# Patient Record
Sex: Male | Born: 1995 | Race: White | Hispanic: No | Marital: Single
Health system: Southern US, Community
[De-identification: ages and names within clinical notes are randomized; demographics above are authoritative.]

---

## 2016-09-03 ENCOUNTER — Encounter: Payer: Self-pay | Admitting: Emergency Medicine

## 2016-09-03 ENCOUNTER — Emergency Department
Admission: EM | Admit: 2016-09-03 | Discharge: 2016-09-03 | Disposition: A | Discharge status: Home / Self Care | Payer: Managed Care, Other (non HMO) | Attending: Student in an Organized Health Care Education/Training Program | Admitting: Student in an Organized Health Care Education/Training Program

## 2016-09-03 DIAGNOSIS — F10120 Alcohol abuse with intoxication, uncomplicated: Secondary | ICD-10-CM | POA: Diagnosis not present

## 2016-09-03 DIAGNOSIS — F10920 Alcohol use, unspecified with intoxication, uncomplicated: Secondary | ICD-10-CM

## 2016-09-03 LAB — ETHANOL: ALCOHOL ETHYL (B): 236 mg/dL — AB (ref <5)

## 2016-09-03 NOTE — ED Notes (Signed)
Woke pt up. Sleepy, knew he was in hospital. Asked if he felt ok, stated he was. Told he to call parents and offered him the phone. He said not right now and closed his eyes.

## 2016-09-03 NOTE — ED Provider Notes (Signed)
[facility] Emergency Department Provider Note  ____________________________________________  Time seen: Approximately 4:55 AM  I have reviewed the triage vital signs and the nursing notes.   HISTORY  Chief Complaint Alcohol Intoxication  Level 5 caveat:  Portions of the history and physical were unable to be obtained due to intoxication   HPI Charles Dennis is a 21 y.o. male with no significant past medical history who presents for alcohol intoxication. According to EMS patient was at a fraternity party this evening where he had 6 beers.Patient was then unresponsive per friends which prompted EMS to be called. Patient was vomiting and was incontinent of stool upon arrival to the ED. Combative initially per EMS. Patient not answering any questions.  History reviewed. No pertinent past medical history.  There are no active problems to display for this patient.   History reviewed. No pertinent surgical history.  Prior to Admission medications   Not on File    Allergies Patient has no known allergies.  History reviewed. No pertinent family history.  Social History Social History  Substance Use Topics  . Smoking status: Unknown If Ever Smoked  . Smokeless tobacco: Not on file  . Alcohol use Yes    Review of Systems Unable to obtain ____________________________________________   PHYSICAL EXAM:  VITAL SIGNS: ED Triage Vitals [09/03/16 0438]  Enc Vitals Group     BP (!) 108/53     Pulse Rate 70     Resp 14     Temp 97.1 F (36.2 C)     Temp Source Axillary     SpO2 98 %     Weight 180 lb (81.6 kg)     Height 6\' 2"  (1.88 m)     Head Circumference      Peak Flow      Pain Score      Pain Loc      Pain Edu?      Excl. in GC?     Constitutional: Sleepy, opens eyes to sternal rub, not answering any questions, intoxicated. HEENT:      Head: Normocephalic and atraumatic.         Eyes: Conjunctivae are normal. Sclera is  non-icteric. EOMI. PERRL      Mouth/Throat: Mucous membranes are moist.       Neck: Supple with no signs of meningismus. Cardiovascular: Regular rate and rhythm. No murmurs, gallops, or rubs. 2+ symmetrical distal pulses are present in all extremities. No JVD. Respiratory: Normal respiratory effort. Lungs are clear to auscultation bilaterally. No wheezes, crackles, or rhonchi.  Gastrointestinal: Soft, non tender, and non distended with positive bowel sounds. No rebound or guarding. Musculoskeletal: Nontender with normal range of motion in all extremities. No edema, cyanosis, or erythema of extremities. Neurologic: Face is symmetric. Moving all extremities. No gross focal neurologic deficits are appreciated. Skin: Skin is warm, dry and intact. No rash noted.   ____________________________________________   LABS (all labs ordered are listed, but only abnormal results are displayed)  Labs Reviewed  ETHANOL - Abnormal; Notable for the following:       Result Value   Alcohol, Ethyl (B) 236 (*)    All other components within normal limits   ____________________________________________  EKG  none  ____________________________________________  RADIOLOGY  none  ____________________________________________   PROCEDURES  Procedure(s) performed: None Procedures Critical Care performed:  None ____________________________________________   INITIAL IMPRESSION / ASSESSMENT AND PLAN / ED COURSE  21 y.o. male with no significant past medical history  who presents for alcohol intoxication. Patient is intoxicated and not answering questions, protecting airway, normal vital signs, breathing and satting 98% on room air. No signs of trauma. Will monitor until sober and reassess. Alcohol level pending.    _________________________ 6:37 AM on 09/03/2016 ----------------------------------------- Alcohol level 236. Patient remains clinically intoxicated. Care transferred to Dr. Roxan Hockeyobinson at  [facility]  Pertinent labs & imaging results that were available during my care of the patient were reviewed by me and considered in my medical decision making (see chart for details).    ____________________________________________   FINAL CLINICAL IMPRESSION(S) / ED DIAGNOSES  Final diagnoses:  Alcoholic intoxication without complication (HCC)      NEW MEDICATIONS STARTED DURING THIS VISIT:  New Prescriptions   No medications on file     Note:  This document was prepared using Dragon voice recognition software and may include unintentional dictation errors.    Nita Sicklearolina Shin Lamour, MD 09/03/16 [phone]

## 2016-09-03 NOTE — ED Provider Notes (Signed)
Patient received in sign-out from Dr. Don PerkingVeronese.  Workup and evaluation pending metabolization of his alcohol and further observation. Patient remains hemodynamically stable. Blood pressure measured as low but while patient was sleeping patient remains well perfused.  Patient was able to tolerate PO and was able to ambulate with a steady gait.  Patient advised not to drive today and does have a safe disposition back to school. The patient is stable for discharge home.[name].      Charles Portillo, MD 09/03/16 1136

## 2016-09-03 NOTE — ED Triage Notes (Signed)
Pt to rm 24 via EMS, EMS report etoh intoxication w/ 6 beers, reports vomiting and BM incontinence.  Pt responsive to stimuli but non-verbal at this time, NAD noted

## 2016-09-03 NOTE — ED Notes (Signed)
Pt's father called, Nat ChristenSteve Reyez, [phone].  Pt unable to give permission to share health information.  Pt's father informed only that pt is here and stable.  Pt to be encouraged to call father when sober

## 2016-09-03 NOTE — Discharge Instructions (Addendum)
You have been seen in the Emergency Department (ED) today for alcohol intoxication.  As we have discussed, please do NOT drink alcohol in excess as alcohol intoxication can be life threatening. NEVER drive or operate machinery after drinking alcohol.  Having fun in college is part of life however this evening you almost ended up intubated in the ICU because of your drinking. If you need help with your drinking contact your local AA.    Do NOT drive today.  Drink plenty of water or other clear liquids (Gatorade, Powerade, etc) for the next 24 hours to help your body recover and stay hydrated.  Follow-up care is a key part of your treatment and safety. Follow up with your doctor in the next 2-5 days. If you need help to stop drinking please call [facility] at [phone]  How can you care for yourself at home?  Be safe with medicines. Take your medicines exactly as prescribed. Call your doctor if you think you are having a problem with your medicine.  Your doctor may have prescribed disulfiram (Antabuse). Do not drink any alcohol while you are taking this medicine. You may have severe or even life-threatening side effects from even small amounts of alcohol.  If you were given medicine to prevent nausea, be sure to take it exactly as prescribed.  Before you take any medicine, tell your doctor if:  You have had a bad reaction to any medicines in the past.  You are taking other medicines, including over-the-counter ones, or have other health problems.  You are or could be pregnant. Be prepared to have some symptoms of withdrawal in the next few days which includes agitation, tremors, anxiety, sweating, headache, nausea, vomiting. Drink plenty of liquids in the next few days to prevent dehydration. Seek help if you need it to stop drinking. Getting counseling and joining a support group can help you stay sober. Try a support group such as Alcoholics Anonymous.  Avoid alcohol when you take medicines. It can  react with many medicines and cause serious problems.  When should you call for help?  Call 911 anytime you think you may need emergency care. For example, call if:  You feel confused and are seeing things that are not there.  You are thinking about killing yourself or hurting others.  You have a seizure.  You vomit blood or what looks like coffee grounds.  Call your doctor now or seek immediate medical care if:  You have trembling, restlessness, sweating, and other withdrawal symptoms that are new or that get worse.  Your withdrawal symptoms come back after not bothering you for days or weeks.  You can't stop vomiting.  Watch closely for changes in your health, and be sure to contact your doctor if:  You need help to stop drinking.

## 2017-10-31 ENCOUNTER — Emergency Department: Payer: Managed Care, Other (non HMO)

## 2017-10-31 ENCOUNTER — Encounter: Payer: Self-pay | Admitting: Emergency Medicine

## 2017-10-31 ENCOUNTER — Emergency Department
Admission: EM | Admit: 2017-10-31 | Discharge: 2017-10-31 | Disposition: A | Discharge status: Home / Self Care | Payer: Managed Care, Other (non HMO) | Attending: Emergency Medicine | Admitting: Emergency Medicine

## 2017-10-31 DIAGNOSIS — J039 Acute tonsillitis, unspecified: Secondary | ICD-10-CM | POA: Diagnosis not present

## 2017-10-31 DIAGNOSIS — J029 Acute pharyngitis, unspecified: Secondary | ICD-10-CM | POA: Diagnosis present

## 2017-10-31 LAB — COMPREHENSIVE METABOLIC PANEL
ALBUMIN: 3.6 g/dL (ref 3.5–5.0)
ALT: 21 U/L (ref 17–63)
AST: 21 U/L (ref 15–41)
Alkaline Phosphatase: 66 U/L (ref 38–126)
Anion gap: 7 (ref 5–15)
BUN: 8 mg/dL (ref 6–20)
CHLORIDE: 99 mmol/L — AB (ref 101–111)
CO2: 30 mmol/L (ref 22–32)
CREATININE: 0.74 mg/dL (ref 0.61–1.24)
Calcium: 9.6 mg/dL (ref 8.9–10.3)
GFR calc Af Amer: >60 mL/min (ref >60)
GLUCOSE: 102 mg/dL — AB (ref 65–99)
Potassium: 4.3 mmol/L (ref 3.5–5.1)
SODIUM: 136 mmol/L (ref 135–145)
Total Bilirubin: 1.3 mg/dL — ABNORMAL HIGH (ref 0.3–1.2)
Total Protein: 8.3 g/dL — ABNORMAL HIGH (ref 6.5–8.1)

## 2017-10-31 LAB — CBC
HCT: 39.1 % — ABNORMAL LOW (ref 40.0–52.0)
Hemoglobin: 13.5 g/dL (ref 13.0–18.0)
MCH: 30.9 pg (ref 26.0–34.0)
MCHC: 34.7 g/dL (ref 32.0–36.0)
MCV: 89.2 fL (ref 80.0–100.0)
Platelets: 257 10*3/uL (ref 150–440)
RBC: 4.38 MIL/uL — ABNORMAL LOW (ref 4.40–5.90)
RDW: 12.3 % (ref 11.5–14.5)
WBC: 13.3 10*3/uL — AB (ref 3.8–10.6)

## 2017-10-31 MED ORDER — IOPAMIDOL (ISOVUE-370) INJECTION 76%
75.0000 mL | Freq: Once | INTRAVENOUS | Status: AC | PRN
  Administered 2017-10-31: 75 mL via INTRAVENOUS
  Filled 2017-10-31: qty 75

## 2017-10-31 MED ORDER — SODIUM CHLORIDE 0.9 % IV BOLUS
1000.0000 mL | Freq: Once | INTRAVENOUS | Status: AC
  Administered 2017-10-31: 1000 mL via INTRAVENOUS

## 2017-10-31 MED ORDER — DEXAMETHASONE SODIUM PHOSPHATE 10 MG/ML IJ SOLN
10.0000 mg | Freq: Once | INTRAMUSCULAR | Status: AC
Start: 2017-10-31 — End: 2017-10-31
  Administered 2017-10-31: 10 mg via INTRAMUSCULAR
  Filled 2017-10-31: qty 1

## 2017-10-31 MED ORDER — LEVOFLOXACIN 500 MG PO TABS: 500.0000 mg | ORAL_TABLET | Freq: Every day | ORAL | 0 refills | Status: AC

## 2017-10-31 MED ORDER — PREDNISONE 10 MG PO TABS: ORAL_TABLET | ORAL | 0 refills | Status: AC

## 2017-10-31 MED ORDER — CLINDAMYCIN PHOSPHATE 600 MG/50ML IV SOLN
600.0000 mg | Freq: Once | INTRAVENOUS | Status: AC
  Administered 2017-10-31: 600 mg via INTRAVENOUS
  Filled 2017-10-31: qty 50

## 2017-10-31 NOTE — ED Triage Notes (Signed)
Pt reports just getting over strep and went to UC today and they sent him to the ED to have his tonsil drained.

## 2017-10-31 NOTE — ED Notes (Signed)
See triage note  States he was recently treated for strept with 10 days go clindamycin.. Developed sore throat yesterday morning   Pain is mainly to right side of throat   Denies any fever

## 2017-10-31 NOTE — Discharge Instructions (Addendum)
Gargle with salt water three times a day. Follow up with ENT this week.

## 2017-10-31 NOTE — ED Notes (Signed)
Waiting on f/u with ENT    Friend at bedside

## 2017-10-31 NOTE — ED Provider Notes (Signed)
[facility] Emergency Department Provider Note  ____________________________________________  Time seen: Approximately 10:24 AM  I have reviewed the triage vital signs and the nursing notes.   HISTORY  Chief Complaint Sore Throat    HPI Charles Dennis is a 22 y.o. male that presents to the emergency department for evaluation of sore throat for 2 weeks that worsened today.  Patient states that he was given 10 days worth of clindamycin at urgent care and finished 9 days of medication last week.  Sore throat improved and then worsened yesterday.  He took the last days worth of clindamycin last night.  He has not checked his temperature but had chills yesterday.  He is able to swallow but with pain.  No shortness of breath.   History reviewed. No pertinent past medical history.  There are no active problems to display for this patient.   History reviewed. No pertinent surgical history.  Prior to Admission medications   Medication Sig Start Date End Date Taking? Authorizing Provider  levofloxacin (LEVAQUIN) 500 MG tablet Take 1 tablet (500 mg total) by mouth daily for 10 days. 10/31/17 11/10/17  Enid Derry, PA-C  predniSONE (DELTASONE) 10 MG tablet Take 6 tablets on day 1 and 2, take 5 tablets on day 3 and 4, take 4 tablets on day 5 and 6, take 3 tablets on day 7 and 8, take 2 tablets on day 9 and 10, take 1 tablet on day 11 and 12 10/31/17   Enid Derry, PA-C    Allergies Penicillins  No family history on file.  Social History Social History   Tobacco Use  . Smoking status: Unknown If Ever Smoked  Substance Use Topics  . Alcohol use: Yes  . Drug use: Not on file     Review of Systems  ENT: Negative for congestion and rhinorrhea. Cardiovascular: No chest pain. Respiratory: Negative for cough. No SOB. Gastrointestinal: No abdominal pain.  No nausea, no vomiting.   Musculoskeletal: Negative for musculoskeletal pain. Neurological:  Negative for headaches.   ____________________________________________   PHYSICAL EXAM:  VITAL SIGNS: ED Triage Vitals  Enc Vitals Group     BP 10/31/17 0944 113/66     Pulse Rate 10/31/17 0944 83     Resp 10/31/17 0944 20     Temp 10/31/17 0944 98 F (36.7 C)     Temp Source 10/31/17 0944 Oral     SpO2 10/31/17 0944 100 %     Weight 10/31/17 0945 158 lb (71.7 kg)     Height 10/31/17 0945 6' (1.829 m)     Head Circumference --      Peak Flow --      Pain Score 10/31/17 0945 10     Pain Loc --      Pain Edu? --      Excl. in GC? --      Constitutional: Alert and oriented. Well appearing and in no acute distress. Eyes: Conjunctivae are normal. PERRL. EOMI. No discharge. Head: Atraumatic. ENT: No frontal and maxillary sinus tenderness.      Ears: Tympanic membranes pearly gray with good landmarks. No discharge.      Nose: No congestion/rhinnorhea.      Mouth/Throat: Mucous membranes are moist. Oropharynx non-erythematous. Tonsils enlarged bilaterally, 2+ on the right, 1+ on the left. No exudates. Uvula midline. Neck: No stridor.   Hematological/Lymphatic/Immunilogical: Anterior cervical lymphadenopathy. Cardiovascular: Normal rate, regular rhythm.  Good peripheral circulation. Respiratory: Normal respiratory effort without tachypnea or retractions. Lungs  CTAB. Good air entry to the bases with no decreased or absent breath sounds. Gastrointestinal: Bowel sounds 4 quadrants. Soft and nontender to palpation. No guarding or rigidity. No palpable masses. No distention. Musculoskeletal: Full range of motion to all extremities. No gross deformities appreciated. Neurologic:  Normal speech and language. No gross focal neurologic deficits are appreciated.  Skin:  Skin is warm, dry and intact.    ____________________________________________   LABS (all labs ordered are listed, but only abnormal results are displayed)  Labs Reviewed  CBC - Abnormal; Notable for the following  components:      Result Value   WBC 13.3 (*)    RBC 4.38 (*)    HCT 39.1 (*)    All other components within normal limits  COMPREHENSIVE METABOLIC PANEL - Abnormal; Notable for the following components:   Chloride 99 (*)    Glucose, Bld 102 (*)    Total Protein 8.3 (*)    Total Bilirubin 1.3 (*)    All other components within normal limits   ____________________________________________  EKG   ____________________________________________  RADIOLOGY Lexine Baton, personally viewed and evaluated these images (plain radiographs) as part of my medical decision making, as well as reviewing the written report by the radiologist.  Ct Soft Tissue Neck W Contrast  Result Date: 10/31/2017 CLINICAL DATA:  Tonsils/adenoid disorder EXAM: CT NECK WITH CONTRAST TECHNIQUE: Multidetector CT imaging of the neck was performed using the standard protocol following the bolus administration of intravenous contrast. CONTRAST:  75mL ISOVUE-370 IOPAMIDOL (ISOVUE-370) INJECTION 76% COMPARISON:  None. FINDINGS: Pharynx and larynx: Thickened tonsils, especially the palatine, with asymmetric low-density thickening at the base of the right tonsillar fossa. Regional submucosal edema not reaching the larynx. Edema tracks into the submental space without floor of mouth swelling. No drainable collection. No retropharyngeal effusion. Salivary glands: No inflammation, mass, or stone. Thyroid: Normal. Lymph nodes: Bilateral cervical chain lymph node enlargement, greater on the right. No cavitation Vascular: Negative Limited intracranial: Negative Visualized orbits: Negative Mastoids and visualized paranasal sinuses: Fluid level in the right maxillary sinus. Patchy mucosal thickening. Retention cyst in the left maxillary sinus. Skeleton: Negative Upper chest: Negative IMPRESSION: 1. Tonsillitis with right peritonsillar phlegmon. No drainable collection. 2. Cervical adenitis 3. Sinusitis with right maxillary fluid level.  Electronically Signed   By: Marnee Spring M.D.   On: 10/31/2017 12:02    ____________________________________________    PROCEDURES  Procedure(s) performed:    Procedures    Medications  sodium chloride 0.9 % bolus 1,000 mL (0 mLs Intravenous Stopped 10/31/17 1410)  dexamethasone (DECADRON) injection 10 mg (10 mg Intramuscular Given 10/31/17 1112)  clindamycin (CLEOCIN) IVPB 600 mg (0 mg Intravenous Stopped 10/31/17 1410)  iopamidol (ISOVUE-370) 76 % injection 75 mL (75 mLs Intravenous Contrast Given 10/31/17 1146)     ____________________________________________   INITIAL IMPRESSION / ASSESSMENT AND PLAN / ED COURSE  Pertinent labs & imaging results that were available during my care of the patient were reviewed by me and considered in my medical decision making (see chart for details).  Review of the [place] CSRS was performed in accordance of the [facility] prior to dispensing any controlled drugs.   Patient's diagnosis is consistent with tonsillitis with plegmon. Vital signs and exam are reassuring. CT neck consistent with tonsillitis with phlegmon. No peritonsillar abscess.  Strep test was not ordered since patient took a dose of antibiotics yesterday.  Patient finished 9 days of  Clindamycin TID last week. He is allergic to amoxicillin and gets  full body hives with medication. ENT was paged several times without response so Dr. Lamont Snowball was consulted and recommended continued ENT page.  Dr. Elenore Rota was consulted and recommended that patient begin  Levaquin and prednisone taper.  Patient will make an appointment in office. Patient is hungry and wants to go home.  Patient appears well. Patient will be discharged home with prescriptions for Levaquin and Prednisone. Patient is to follow up with ENT as needed or otherwise directed. Patient is given ED precautions to return to the ED for any worsening or new symptoms.     ____________________________________________  FINAL CLINICAL  IMPRESSION(S) / ED DIAGNOSES  Final diagnoses:  Tonsillitis      NEW MEDICATIONS STARTED DURING THIS VISIT:  ED Discharge Orders        Ordered    levofloxacin (LEVAQUIN) 500 MG tablet  Daily     10/31/17 1546    predniSONE (DELTASONE) 10 MG tablet     10/31/17 1546          This chart was dictated using voice recognition software/Dragon. Despite best efforts to proofread, errors can occur which can change the meaning. Any change was purely unintentional.    Enid Derry, PA-C 10/31/17 1624    Merrily Brittle, MD 10/31/17 [phone]

## 2017-11-02 ENCOUNTER — Other Ambulatory Visit
Admission: RE | Admit: 2017-11-02 | Discharge: 2017-11-02 | Disposition: A | Discharge status: Home / Self Care | Payer: Managed Care, Other (non HMO) | Source: Ambulatory Visit | Attending: Otolaryngology | Admitting: Otolaryngology

## 2017-11-02 DIAGNOSIS — J039 Acute tonsillitis, unspecified: Secondary | ICD-10-CM | POA: Diagnosis not present

## 2017-11-02 LAB — MONONUCLEOSIS SCREEN: Mono Screen: NEGATIVE

## 2017-11-03 ENCOUNTER — Emergency Department: Payer: Managed Care, Other (non HMO)

## 2017-11-03 DIAGNOSIS — R2241 Localized swelling, mass and lump, right lower limb: Secondary | ICD-10-CM | POA: Insufficient documentation

## 2017-11-03 DIAGNOSIS — Z79899 Other long term (current) drug therapy: Secondary | ICD-10-CM | POA: Diagnosis not present

## 2017-11-03 DIAGNOSIS — R233 Spontaneous ecchymoses: Secondary | ICD-10-CM | POA: Insufficient documentation

## 2017-11-03 DIAGNOSIS — N5089 Other specified disorders of the male genital organs: Secondary | ICD-10-CM | POA: Diagnosis not present

## 2017-11-03 DIAGNOSIS — M79606 Pain in leg, unspecified: Secondary | ICD-10-CM | POA: Diagnosis present

## 2017-11-03 LAB — CBC WITH DIFFERENTIAL/PLATELET
BASOS PCT: 0 %
Basophils Absolute: 0 10*3/uL (ref 0–0.1)
Eosinophils Absolute: 0 10*3/uL (ref 0–0.7)
Eosinophils Relative: 0 %
HEMATOCRIT: 35.9 % — AB (ref 40.0–52.0)
Hemoglobin: 12.7 g/dL — ABNORMAL LOW (ref 13.0–18.0)
LYMPHS PCT: 13 %
Lymphs Abs: 1.2 10*3/uL (ref 1.0–3.6)
MCH: 31.5 pg (ref 26.0–34.0)
MCHC: 35.5 g/dL (ref 32.0–36.0)
MCV: 88.7 fL (ref 80.0–100.0)
MONO ABS: 0.9 10*3/uL (ref 0.2–1.0)
MONOS PCT: 10 %
Neutro Abs: 7.1 10*3/uL — ABNORMAL HIGH (ref 1.4–6.5)
Neutrophils Relative %: 77 %
Platelets: 302 10*3/uL (ref 150–440)
RBC: 4.05 MIL/uL — ABNORMAL LOW (ref 4.40–5.90)
RDW: 12.6 % (ref 11.5–14.5)
WBC: 9.3 10*3/uL (ref 3.8–10.6)

## 2017-11-03 LAB — COMPREHENSIVE METABOLIC PANEL
ALK PHOS: 57 U/L (ref 38–126)
ALT: 25 U/L (ref 17–63)
AST: 30 U/L (ref 15–41)
Albumin: 3.6 g/dL (ref 3.5–5.0)
Anion gap: 9 (ref 5–15)
BUN: 15 mg/dL (ref 6–20)
CALCIUM: 9.4 mg/dL (ref 8.9–10.3)
CO2: 27 mmol/L (ref 22–32)
CREATININE: 0.7 mg/dL (ref 0.61–1.24)
Chloride: 101 mmol/L (ref 101–111)
Glucose, Bld: 111 mg/dL — ABNORMAL HIGH (ref 65–99)
Potassium: 4 mmol/L (ref 3.5–5.1)
Sodium: 137 mmol/L (ref 135–145)
Total Bilirubin: 0.5 mg/dL (ref 0.3–1.2)
Total Protein: 8.2 g/dL — ABNORMAL HIGH (ref 6.5–8.1)

## 2017-11-03 LAB — PROTIME-INR
INR: 1.02
Prothrombin Time: 13.3 seconds (ref 11.4–15.2)

## 2017-11-03 NOTE — ED Triage Notes (Signed)
patient c/o right calf pain beginning Tuesday. Patient Patient reports long car drive on Sunday. Patient reports being prescribed clindamycin on Sunday for pharyngitis. Patient reports no relief of symptoms, and was started on levaquin and steroids on Tuesday. Patient also had CT neck performed to rule out abscess. Patient negative for both abscess and mononucleosis. Patient was started on Septra on Wednesday by ENT MD. Patient reports left testicle swelling beginning yesterday. Patient has swelling noted to right calf on assessment. Patient has purpura to bilateral feet. Patient has bruising to right medial ankle. Patient has weak pulse right foot.

## 2017-11-04 ENCOUNTER — Emergency Department: Payer: Managed Care, Other (non HMO)

## 2017-11-04 ENCOUNTER — Emergency Department
Admission: EM | Admit: 2017-11-04 | Discharge: 2017-11-04 | Disposition: A | Discharge status: Home / Self Care | Payer: Managed Care, Other (non HMO) | Attending: Emergency Medicine | Admitting: Emergency Medicine

## 2017-11-04 DIAGNOSIS — N5089 Other specified disorders of the male genital organs: Secondary | ICD-10-CM

## 2017-11-04 NOTE — Discharge Instructions (Addendum)
Please return for worse rash, fever or any other change in symptoms. Please stop taking the Bactrim for now. Dr. Smith Robert should arrange follow-up for you in the next day or 2. If she does not call you please call her office and arrange follow-up again in the next few days. the ultrasound only showed a varicocele. I would just follow up with her primary care doctor for this.

## 2017-11-04 NOTE — ED Provider Notes (Signed)
[facility] Emergency Department Provider Note   ____________________________________________   First MD Initiated Contact with Patient 11/04/17 0119     (approximate)  I have reviewed the triage vital signs and the nursing notes.   HISTORY  Chief Complaint Leg Pain    HPI Charles Dennis is a 22 y.o. male Patient reports initially came in with sore throat was put on clindamycin. His CT is neck which showed a developing abscess in the tonsil but had not turned into a complete abscess yet. He also had sinusitis on the CT. He had almost completed the course of antibiotics was switched to Levaquin but then developed pain in the calf to the Levaquin was stopped after 1 dose and he was switched to Bactrim. Patient also developed a rash on both of his legs and about the time his put on the Levaquin. The rash is petechial. He is not running a fever. He has no belly pain. He does complain of some left scrotal swelling. Ultrasound of the scrotum today showed varicocele.  History reviewed. No pertinent past medical history.  There are no active problems to display for this patient.   History reviewed. No pertinent surgical history.  Prior to Admission medications   Medication Sig Start Date End Date Taking? Authorizing Provider  levofloxacin (LEVAQUIN) 500 MG tablet Take 1 tablet (500 mg total) by mouth daily for 10 days. 10/31/17 11/10/17  Enid Derry, PA-C  predniSONE (DELTASONE) 10 MG tablet Take 6 tablets on day 1 and 2, take 5 tablets on day 3 and 4, take 4 tablets on day 5 and 6, take 3 tablets on day 7 and 8, take 2 tablets on day 9 and 10, take 1 tablet on day 11 and 12 10/31/17   Enid Derry, PA-C    Allergies Penicillins  No family history on file.  Social History Social History   Tobacco Use  . Smoking status: Unknown If Ever Smoked  Substance Use Topics  . Alcohol use: Yes  . Drug use: Not on file    Review of  Systems  Constitutional: No fever/chills Eyes: No visual changes. ENT: No sore throat. Cardiovascular: Denies chest pain. Respiratory: Denies shortness of breath. Gastrointestinal: No abdominal pain.  No nausea, no vomiting.  No diarrhea.  No constipation. Genitourinary: Negative for dysuria. Musculoskeletal: Negative for back pain. Skin: Negative for rash. Neurological: Negative for headaches, focal weakness or numbness.   ____________________________________________   PHYSICAL EXAM:  VITAL SIGNS: ED Triage Vitals  Enc Vitals Group     BP 11/03/17 2041 (!) 141/78     Pulse Rate 11/03/17 2041 88     Resp 11/03/17 2041 18     Temp 11/03/17 2041 98.8 F (37.1 C)     Temp src --      SpO2 11/03/17 2041 98 %     Weight 11/03/17 2041 160 lb (72.6 kg)     Height 11/03/17 2041 6' (1.829 m)     Head Circumference --      Peak Flow --      Pain Score 11/03/17 2040 5     Pain Loc --      Pain Edu? --      Excl. in GC? --     Constitutional: Alert and oriented. Well appearing and in no acute distress. Eyes: Conjunctivae are normal. Head: Atraumatic. Nose: No congestion/rhinnorhea. Mouth/Throat: Mucous membranes are moist.  Oropharynx non-erythematous.no further swelling Neck: No stridor.  no tender or swollen lymph  nodes Cardiovascular: Normal rate, regular rhythm. Grossly normal heart sounds.  Good peripheral circulation. Respiratory: Normal respiratory effort.  No retractions. Lungs CTAB. Gastrointestinal: Soft and nontender. No distention. No abdominal bruits. No CVA tenderness.no organomegaly Musculoskeletal: No lower extremity tenderness nor edema.  No joint effusions. Neurologic:  Normal speech and language. No gross focal neurologic deficits are appreciated. No gait instability. Skin:  Skin is warm, dry and intact. there is a petechial rash from the knees down on both legs including the soles of feet. The right sole is also bruised. The lright leg is somewhat swollen.  But only mildly.Marland Kitchen Psychiatric: Mood and affect are normal. Speech and behavior are normal.  ____________________________________________   LABS (all labs ordered are listed, but only abnormal results are displayed)  Labs Reviewed  CBC WITH DIFFERENTIAL/PLATELET - Abnormal; Notable for the following components:      Result Value   RBC 4.05 (*)    Hemoglobin 12.7 (*)    HCT 35.9 (*)    Neutro Abs 7.1 (*)    All other components within normal limits  COMPREHENSIVE METABOLIC PANEL - Abnormal; Notable for the following components:   Glucose, Bld 111 (*)    Total Protein 8.2 (*)    All other components within normal limits  PROTIME-INR   ____________________________________________  EKG   ____________________________________________  RADIOLOGY  ED MD interpretation: Doppler of the leg shows no DVT  Official radiology report(s): US Venous Img Lower Unilateral Right  Result Date: 11/03/2017 CLINICAL DATA:  Right leg swelling x3 days EXAM: RIGHT LOWER EXTREMITY VENOUS DOPPLER ULTRASOUND TECHNIQUE: Gray-scale sonography with graded compression, as well as color Doppler and duplex ultrasound were performed to evaluate the lower extremity deep venous systems from the level of the common femoral vein and including the common femoral, femoral, profunda femoral, popliteal and calf veins including the posterior tibial, peroneal and gastrocnemius veins when visible. The superficial great saphenous vein was also interrogated. Spectral Doppler was utilized to evaluate flow at rest and with distal augmentation maneuvers in the common femoral, femoral and popliteal veins. COMPARISON:  None. FINDINGS: Contralateral Common Femoral Vein: Respiratory phasicity is normal and symmetric with the symptomatic side. No evidence of thrombus. Normal compressibility. Common Femoral Vein: No evidence of thrombus. Normal compressibility, respiratory phasicity and response to augmentation. Saphenofemoral Junction: No  evidence of thrombus. Normal compressibility and flow on color Doppler imaging. Profunda Femoral Vein: No evidence of thrombus. Normal compressibility and flow on color Doppler imaging. Femoral Vein: No evidence of thrombus. Normal compressibility, respiratory phasicity and response to augmentation. Popliteal Vein: No evidence of thrombus. Normal compressibility, respiratory phasicity and response to augmentation. Calf Veins: No evidence of thrombus. Normal compressibility and flow on color Doppler imaging. Superficial Great Saphenous Vein: No evidence of thrombus. Normal compressibility. Venous Reflux:  None. Other Findings: 1.4 x 1.1 x 1.8 cm hypoechoic lesion in the right groin, favoring a small seroma, less likely a lymph node. IMPRESSION: No evidence of deep venous thrombosis. Electronically Signed   By: Charline Bills M.D.   On: 11/03/2017 23:27   US Scrotum W/doppler  Result Date: 11/04/2017 CLINICAL DATA:  Left testicular swelling x2 days EXAM: SCROTAL ULTRASOUND DOPPLER ULTRASOUND OF THE TESTICLES TECHNIQUE: Complete ultrasound examination of the testicles, epididymis, and other scrotal structures was performed. Color and spectral Doppler ultrasound were also utilized to evaluate blood flow to the testicles. COMPARISON:  None. FINDINGS: Right testicle Measurements: 4.2 x 1.6 x 2.9 cm. No mass or microlithiasis visualized. Left testicle Measurements: 3.8 x  1.8 x 2.8 cm. No mass or microlithiasis visualized. Right epididymis:  Normal in size and appearance. Left epididymis:  Small 3 mm in diameter epididymal cyst. Hydrocele:  None visualized. Varicocele:  Small varicoceles are noted on the left. Pulsed Doppler interrogation of both testes demonstrates normal low resistance arterial and venous waveforms bilaterally. IMPRESSION: 1. No intratesticular mass or torsion. 2. Tiny 3 mm epididymal cyst on the left in addition to left-sided varicoceles accentuated by Valsalva maneuver. Electronically Signed    By: Tollie Eth M.D.   On: 11/04/2017 03:00    ____________________________________________   PROCEDURES  Procedure(s) performed:   Procedures  Critical Care performed:   ____________________________________________   INITIAL IMPRESSION / ASSESSMENT AND PLAN / ED COURSE  patient's mother is a nurse moderate front [employer]. We do not have any ID docs on call today however I did talk to hematology oncology and they will see him to follow up the rash. I have also told them to follow up with primary care for the varicocele. Primary care can decide if he needs referral to urology.         ____________________________________________   FINAL CLINICAL IMPRESSION(S) / ED DIAGNOSES  Final diagnoses:  Testicular swelling  also petechial rash   ED Discharge Orders    None       Note:  This document was prepared using Dragon voice recognition software and may include unintentional dictation errors.    Arnaldo Natal, MD 11/04/17 [phone]

## 2017-11-08 ENCOUNTER — Inpatient Hospital Stay: Payer: Managed Care, Other (non HMO) | Admitting: Hematology and Oncology

## 2017-11-08 DIAGNOSIS — D649 Anemia, unspecified: Secondary | ICD-10-CM | POA: Insufficient documentation

## 2017-11-08 NOTE — Progress Notes (Deleted)
[facility] day:  11/08/2017  Chief Complaint: Charles Dennis is a 21 y.o. male with a drop in hemoglobin who is referred in consultation by Dr Corinna Capra for assessment and management.  HPI: ***  The patient was seen in the [facility] ER on 10/31/2017 with a sore throat x 2 weeks.  He had previously received 10 days of clindamycin from an urgent care facility.  Sore throat improved then worsened.  Neck CT revealed tonsillitis with right tonsillar phlegmon.  There was no drainable collection.  He had cervical adenitis and sinusitis with a right maxillary fluid level.  He was prescribed Levaquin and a prednisone taper.  He was seen by ENT.  He developed pain in his calf and a rash after 1 dose of Levaquin.  Levaquin was stopped.  Septra was started on 11/02/2017.  He was seen in the [facility] ER on 11/03/2017 with right calf pain, left testicle swelling, and bruising in his feet/legs. Scrotal ultrasound revealed a varicocele.  Exam revealed a petechial rash from the knees down on both legs and the soles of the feet.  Right lower extremity duplex revealed no DVT.  CBCs are as follows: 10/31/2017:  Hematocrit 39.1, hemoglobin 13.5, MCV 89.2, platelets 257,000, WBC 13,300. 11/03/2017:  Hematocrit 35.9, hemoglobin 12.7, MCV 88.7, platelets 302,000, WBC 9,300 with ANC of 7100.  Differential was unremarkable.  CMP notable for bilirubin 1.3 on 10/31/2017.  Total protein was 8.2-8.3.  Creatinine was 0.70 on 10/31/2017.  Monoscreen was negative.     No past medical history on file.  No past surgical history on file.  No family history on file.  Social History:  reports that he drinks alcohol. His tobacco and drug histories are not on file.  The patient is accompanied by *** alone today.  Allergies:  Allergies  Allergen Reactions  . Penicillins Rash    Current Medications: Current Outpatient Medications  Medication Sig Dispense Refill  .  levofloxacin (LEVAQUIN) 500 MG tablet Take 1 tablet (500 mg total) by mouth daily for 10 days. 10 tablet 0  . predniSONE (DELTASONE) 10 MG tablet Take 6 tablets on day 1 and 2, take 5 tablets on day 3 and 4, take 4 tablets on day 5 and 6, take 3 tablets on day 7 and 8, take 2 tablets on day 9 and 10, take 1 tablet on day 11 and 12 42 tablet 0   No current facility-administered medications for this visit.     Review of Systems:  GENERAL:  Feels good.  Active.  No fevers, sweats or weight loss. PERFORMANCE STATUS (ECOG):  *** HEENT:  No visual changes, runny nose, sore throat, mouth sores or tenderness. Lungs: No shortness of breath or cough.  No hemoptysis. Cardiac:  No chest pain, palpitations, orthopnea, or PND. GI:  No nausea, vomiting, diarrhea, constipation, melena or hematochezia. GU:  No urgency, frequency, dysuria, or hematuria. Musculoskeletal:  No back pain.  No joint pain.  No muscle tenderness. Extremities:  No pain or swelling. Skin:  No rashes or skin changes. Neuro:  No headache, numbness or weakness, balance or coordination issues. Endocrine:  No diabetes, thyroid issues, hot flashes or night sweats. Psych:  No mood changes, depression or anxiety. Pain:  No focal pain. Review of systems:  All other systems reviewed and found to be negative.  Physical Exam: There were no vitals taken for this visit. GENERAL:  Well developed, well nourished, **man sitting comfortably in the  exam room in no acute distress. MENTAL STATUS:  Alert and oriented to person, place and time. HEAD:  *** hair.  Normocephalic, atraumatic, face symmetric, no Cushingoid features. EYES:  *** eyes.  Pupils equal round and reactive to light and accomodation.  No conjunctivitis or scleral icterus. ENT:  Oropharynx clear without lesion.  Tongue normal. Mucous membranes moist.  RESPIRATORY:  Clear to auscultation without rales, wheezes or rhonchi. CARDIOVASCULAR:  Regular rate and rhythm without murmur, rub  or gallop. ABDOMEN:  Soft, non-tender, with active bowel sounds, and no hepatosplenomegaly.  No masses. SKIN:  No rashes, ulcers or lesions. EXTREMITIES: No edema, no skin discoloration or tenderness.  No palpable cords. LYMPH NODES: No palpable cervical, supraclavicular, axillary or inguinal adenopathy  NEUROLOGICAL: Unremarkable. PSYCH:  Appropriate.   No visits with results within 3 Day(s) from this visit.  Latest known visit with results is:  Admission on 11/04/2017, Discharged on 11/04/2017  Component Date Value Ref Range Status  . WBC 11/03/2017 9.3  3.8 - 10.6 K/uL Final  . RBC 11/03/2017 4.05* 4.40 - 5.90 MIL/uL Final  . Hemoglobin 11/03/2017 12.7* 13.0 - 18.0 g/dL Final  . HCT 11/03/2017 35.9* 40.0 - 52.0 % Final  . MCV 11/03/2017 88.7  80.0 - 100.0 fL Final  . MCH 11/03/2017 31.5  26.0 - 34.0 pg Final  . MCHC 11/03/2017 35.5  32.0 - 36.0 g/dL Final  . RDW 11/03/2017 12.6  11.5 - 14.5 % Final  . Platelets 11/03/2017 302  150 - 440 K/uL Final  . Neutrophils Relative % 11/03/2017 77  % Final  . Neutro Abs 11/03/2017 7.1* 1.4 - 6.5 K/uL Final  . Lymphocytes Relative 11/03/2017 13  % Final  . Lymphs Abs 11/03/2017 1.2  1.0 - 3.6 K/uL Final  . Monocytes Relative 11/03/2017 10  % Final  . Monocytes Absolute 11/03/2017 0.9  0.2 - 1.0 K/uL Final  . Eosinophils Relative 11/03/2017 0  % Final  . Eosinophils Absolute 11/03/2017 0.0  0 - 0.7 K/uL Final  . Basophils Relative 11/03/2017 0  % Final  . Basophils Absolute 11/03/2017 0.0  0 - 0.1 K/uL Final   Performed at [facility], [address] [place], [place] [address]  . Prothrombin Time 11/03/2017 13.3  11.4 - 15.2 seconds Final  . INR 11/03/2017 1.02   Final   Performed at [facility], [address] [place],  [address]  . Sodium 11/03/2017 137  135 - 145 mmol/L Final  . Potassium 11/03/2017 4.0  3.5 - 5.1 mmol/L Final  . Chloride 11/03/2017 101  101 - 111 mmol/L Final  . CO2 11/03/2017 27  22 -  32 mmol/L Final  . Glucose, Bld 11/03/2017 111* 65 - 99 mg/dL Final  . BUN 11/03/2017 15  6 - 20 mg/dL Final  . Creatinine, Ser 11/03/2017 0.70  0.61 - 1.24 mg/dL Final  . Calcium 11/03/2017 9.4  8.9 - 10.3 mg/dL Final  . Total Protein 11/03/2017 8.2* 6.5 - 8.1 g/dL Final  . Albumin 11/03/2017 3.6  3.5 - 5.0 g/dL Final  . AST 11/03/2017 30  15 - 41 U/L Final  . ALT 11/03/2017 25  17 - 63 U/L Final  . Alkaline Phosphatase 11/03/2017 57  38 - 126 U/L Final  . Total Bilirubin 11/03/2017 0.5  0.3 - 1.2 mg/dL Final  . GFR calc non Af Amer 11/03/2017 >60  >60 mL/min Final  . GFR calc Af Amer 11/03/2017 >60  >60 mL/min Final  Comment: (NOTE) The eGFR has been calculated using the CKD EPI equation. This calculation has not been validated in all clinical situations. eGFR's persistently <60 mL/min signify possible Chronic Kidney Disease.   Georgiann Hahn gap 11/03/2017 9  5 - 15 Final   Performed at [facility], [address] [place], [place] [address]    Assessment:  Charles Dennis is a 21 y.o. male ***  Plan: 1.  Discuss recent events. 2.  Labs today:  CBC with diff, ferritin, iron studies, retic, DAT, CMP, direct bilirubin, immunoglobulin levels, EBV and CMV serologies, PT, PTT. 3.   [place], MD  11/08/2017, 5:10 AM   I saw and evaluated the patient, participating in the key portions of the service and reviewing pertinent diagnostic studies and records.  I reviewed the nurse practitioner's note and agree with the findings and the plan.  The assessment and plan were discussed with the patient.  Additional diagnostic studies of *** are needed to clarify *** and would change the clinical management.  A few ***multiple questions were asked by the patient and answered.   Nolon Stalls, MD 11/08/2017,5:10 AM

## 2017-11-09 ENCOUNTER — Telehealth: Payer: Self-pay | Admitting: *Deleted

## 2017-11-09 NOTE — Telephone Encounter (Signed)
Called patient to Reshcedule the No Show  11/08/17 [place] appt. However, patient stated that he has no intentions of R/S appt.  Patient said that he did not need to be seen.

## 2019-05-21 IMAGING — CT CT NECK W/ CM
4 of 5 series · 15 of 33 positions shown, 17 images · IV contrast (iopamidol)
Comparison: None.

CLINICAL DATA: Tonsils/adenoid disorder

EXAM:
CT NECK WITH CONTRAST
TECHNIQUE: Multidetector CT imaging of the neck was performed using the
standard protocol following the bolus administration of intravenous
contrast.
CONTRAST:  75mL 5LMGSJ-A2U IOPAMIDOL (5LMGSJ-A2U) INJECTION 76%

[Series 2: axial neck · axial · 0.54mm/px · z∈[-399,-263]mm · 3 of 138 slices shown]
[im 35/138  bone]
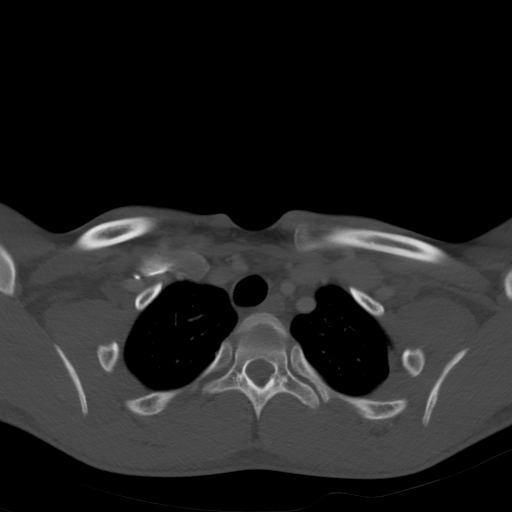
[im 69/138  bone]
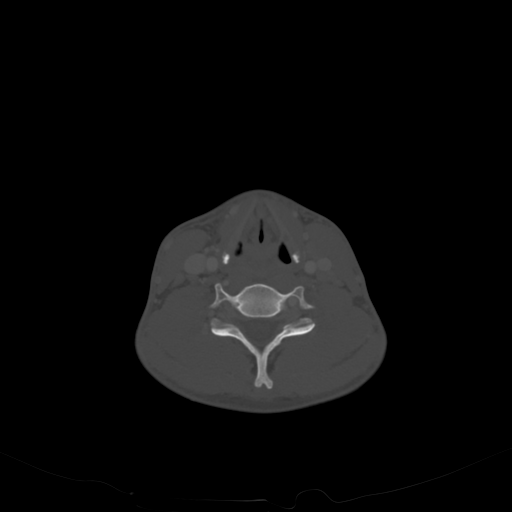
[im 103/138  bone]
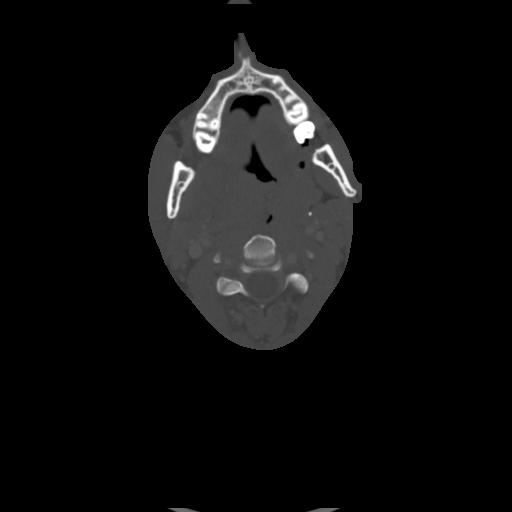

[Series 6: sag neck · sagittal · 0.55mm/px · 5 of 92 slices shown, 6 images]
[im 31/92  bone]
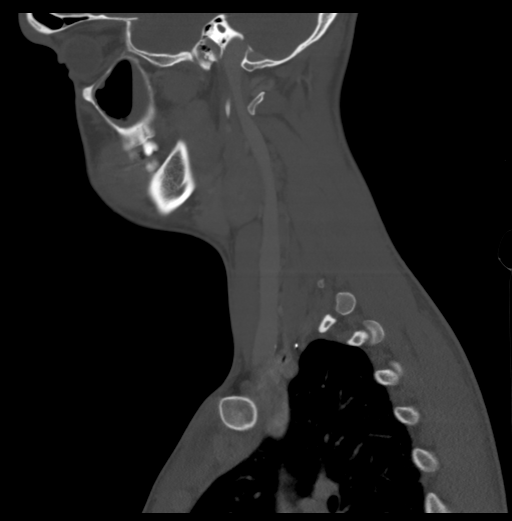
[im 38/92  bone]
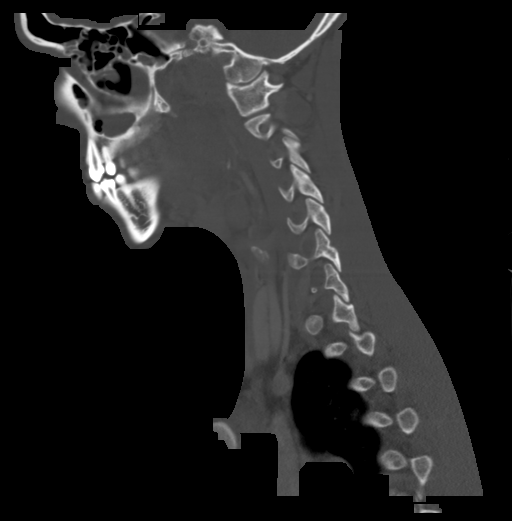
[im 46/92  soft-tissue]
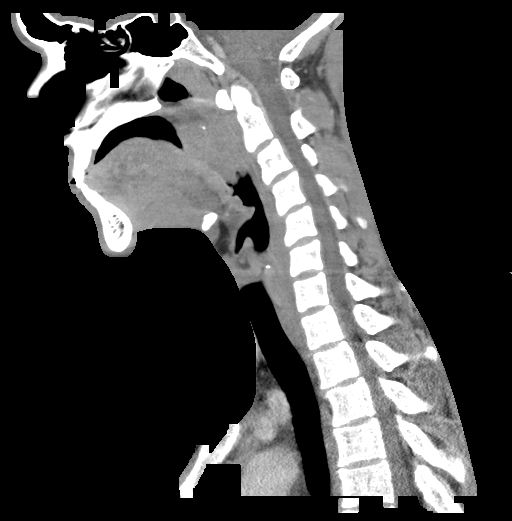
[im 46/92  bone]
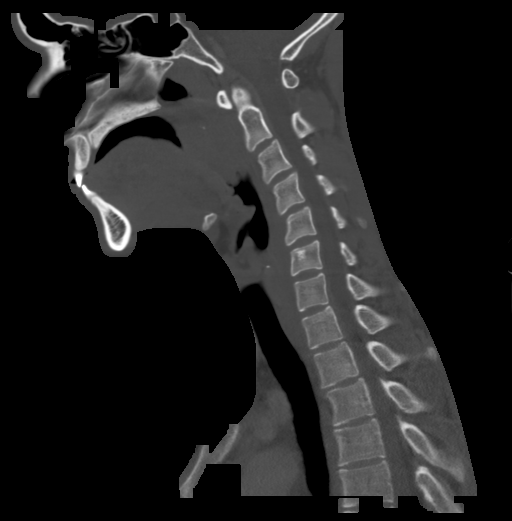
[im 54/92  bone]
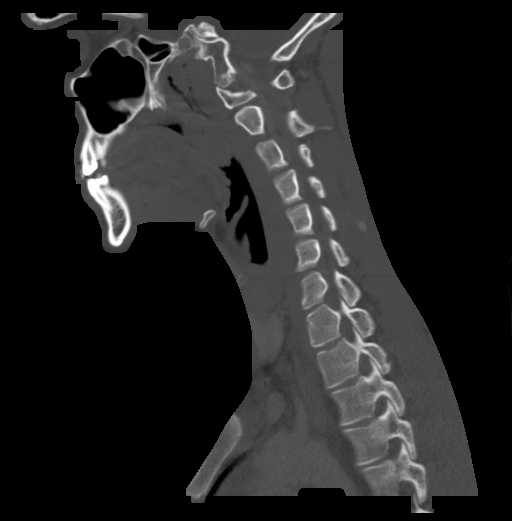
[im 61/92  bone]
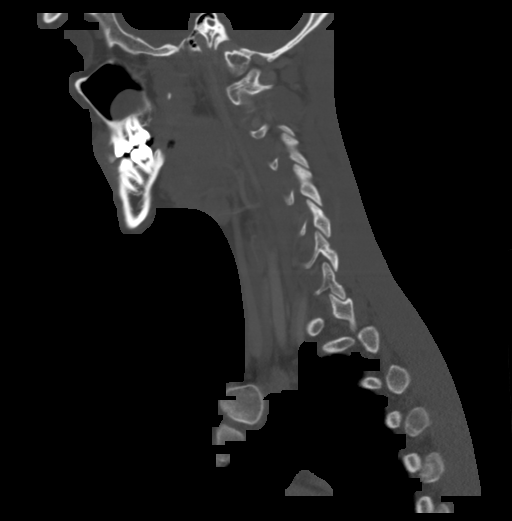

[Series 7: cor neck · coronal · 0.41mm/px · 3 of 139 slices shown]
[im 28/139  bone]
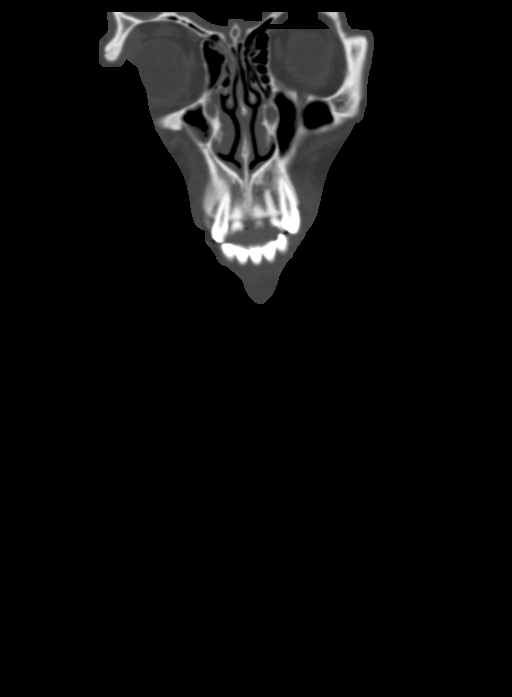
[im 56/139  bone]
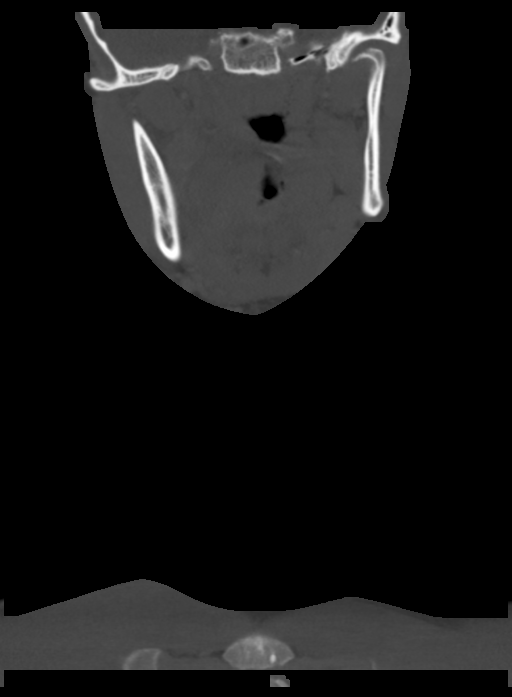
[im 83/139  bone]
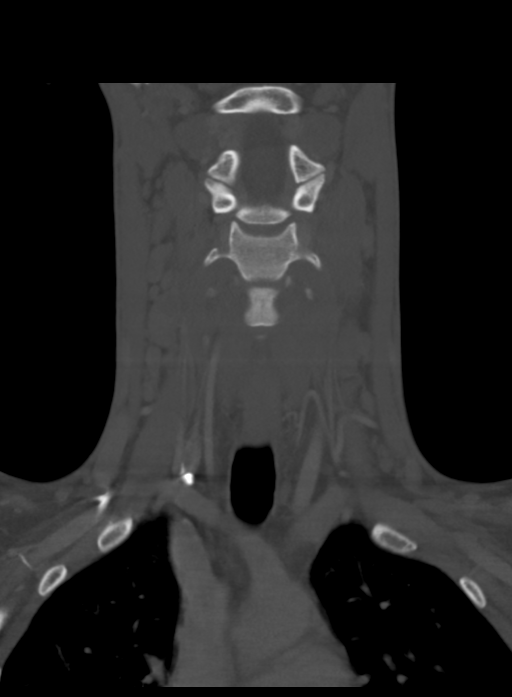

[Series 8: orthogonal ax · axial · 0.39mm/px · z∈[-413,-249]mm · 4 of 138 slices shown, 5 images]
[im 28/138  soft-tissue]
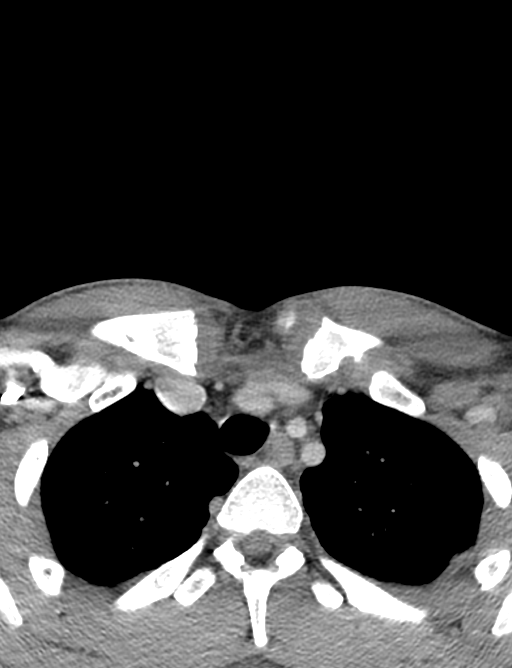
[im 28/138  bone]
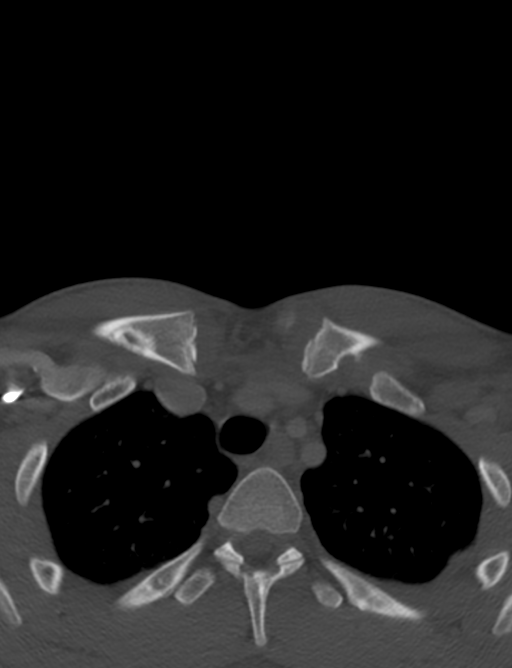
[im 55/138  bone]
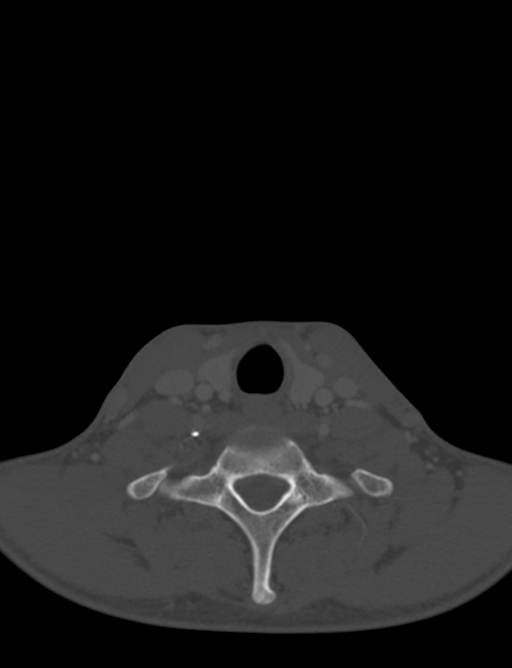
[im 83/138  bone]
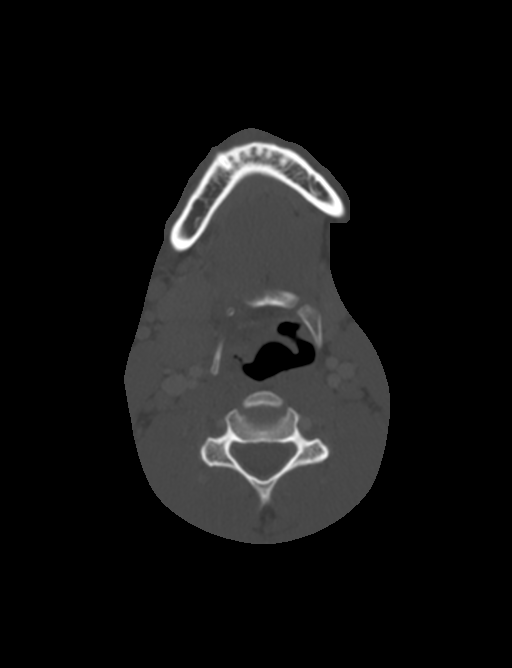
[im 110/138  bone]
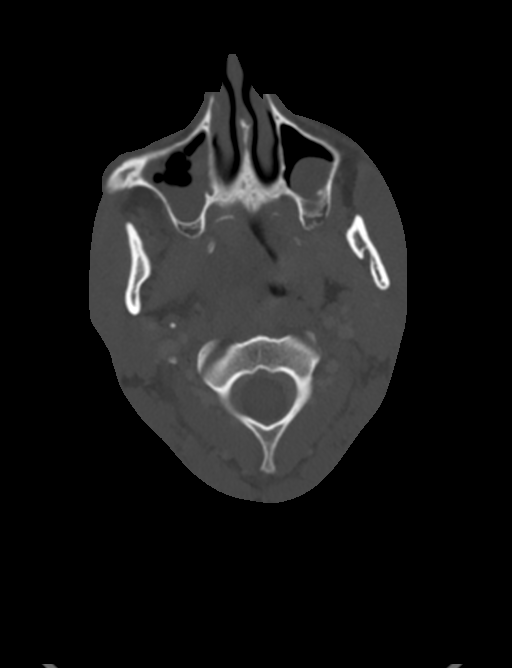

[15 of 33 positions shown; findings below may reference images not displayed]

FINDINGS: Pharynx and larynx: Thickened tonsils, especially the palatine, with
asymmetric low-density thickening at the base of the right tonsillar
fossa. Regional submucosal edema not reaching the larynx. Edema
tracks into the submental space without floor of mouth swelling. No
drainable collection. No retropharyngeal effusion.

Salivary glands: No inflammation, mass, or stone.

Thyroid: Normal.

Lymph nodes: Bilateral cervical chain lymph node enlargement,
greater on the right. No cavitation

Vascular: Negative

Limited intracranial: Negative

Visualized orbits: Negative

Mastoids and visualized paranasal sinuses: Fluid level in the right
maxillary sinus. Patchy mucosal thickening. Retention cyst in the
left maxillary sinus.

Skeleton: Negative

Upper chest: Negative
IMPRESSION: 1. Tonsillitis with right peritonsillar phlegmon. No drainable
collection.
2. Cervical adenitis
3. Sinusitis with right maxillary fluid level.

## 2019-05-22 ENCOUNTER — Other Ambulatory Visit: Payer: Self-pay

## 2019-05-22 DIAGNOSIS — Z20822 Contact with and (suspected) exposure to covid-19: Secondary | ICD-10-CM

## 2019-05-23 LAB — NOVEL CORONAVIRUS, NAA: SARS-CoV-2, NAA: NOT DETECTED

## 2019-10-02 ENCOUNTER — Ambulatory Visit: Payer: Managed Care, Other (non HMO)
